# Patient Record
Sex: Male | Born: 1975 | Race: White | Hispanic: No | Marital: Married | Smoking: Former smoker
Health system: Southern US, Community
[De-identification: ages and names within clinical notes are randomized; demographics above are authoritative.]

## PROBLEM LIST (undated history)

## (undated) DIAGNOSIS — T7840XA Allergy, unspecified, initial encounter: Secondary | ICD-10-CM

## (undated) DIAGNOSIS — S42009A Fracture of unspecified part of unspecified clavicle, initial encounter for closed fracture: Secondary | ICD-10-CM

## (undated) DIAGNOSIS — F329 Major depressive disorder, single episode, unspecified: Secondary | ICD-10-CM

## (undated) DIAGNOSIS — Z8619 Personal history of other infectious and parasitic diseases: Secondary | ICD-10-CM

## (undated) DIAGNOSIS — F32A Depression, unspecified: Secondary | ICD-10-CM

## (undated) DIAGNOSIS — R739 Hyperglycemia, unspecified: Secondary | ICD-10-CM

## (undated) DIAGNOSIS — G4733 Obstructive sleep apnea (adult) (pediatric): Secondary | ICD-10-CM

## (undated) DIAGNOSIS — K219 Gastro-esophageal reflux disease without esophagitis: Secondary | ICD-10-CM

## (undated) HISTORY — DX: Major depressive disorder, single episode, unspecified: F32.9

## (undated) HISTORY — DX: Allergy, unspecified, initial encounter: T78.40XA

## (undated) HISTORY — DX: Personal history of other infectious and parasitic diseases: Z86.19

## (undated) HISTORY — DX: Fracture of unspecified part of unspecified clavicle, initial encounter for closed fracture: S42.009A

## (undated) HISTORY — PX: HERNIA REPAIR: SHX51

## (undated) HISTORY — DX: Obstructive sleep apnea (adult) (pediatric): G47.33

## (undated) HISTORY — DX: Depression, unspecified: F32.A

## (undated) HISTORY — DX: Hyperglycemia, unspecified: R73.9

---

## 2007-06-24 ENCOUNTER — Emergency Department: Payer: Self-pay | Admitting: Emergency Medicine

## 2008-10-08 IMAGING — CT CT ABD-PELV W/O CM
1 of 2 series · 15 of 32 positions shown, 19 images · non-contrast
Comparison: none

REASON FOR EXAM: (1) abdominal pain, vomiting; (2) abdominal pain
COMMENTS:   LMP: (Male)

[Series 2: stone · axial · 0.84mm/px · z∈[-25,+503]mm · 15 of 192 slices shown, 19 images]
[im 8/192  soft-tissue]
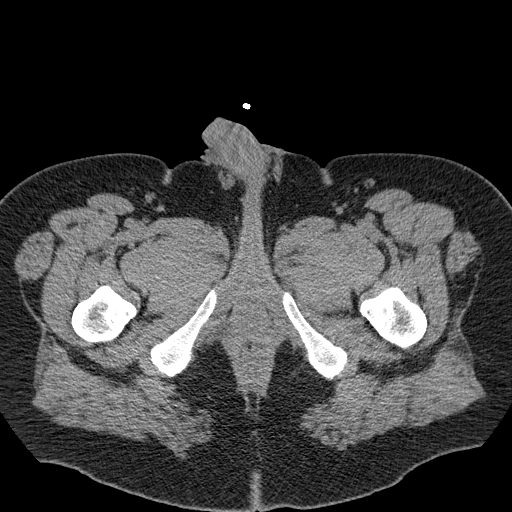
[im 8/192  bone]
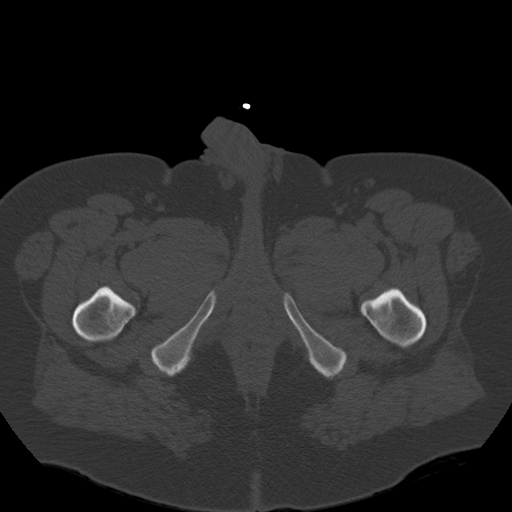
[im 23/192  soft-tissue]
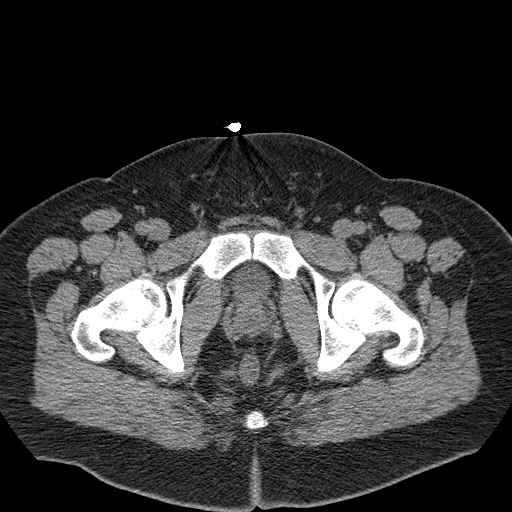
[im 37/192  soft-tissue]
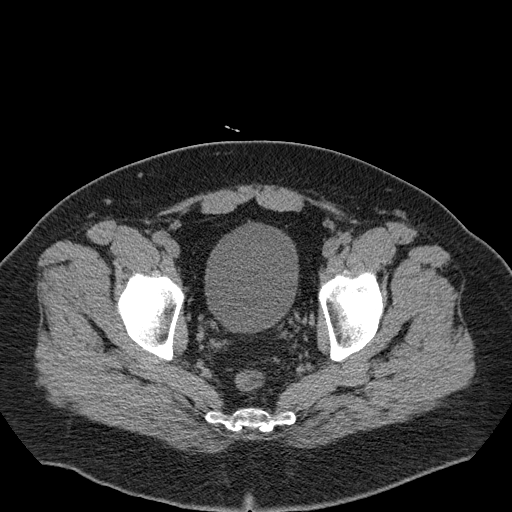
[im 52/192  soft-tissue]
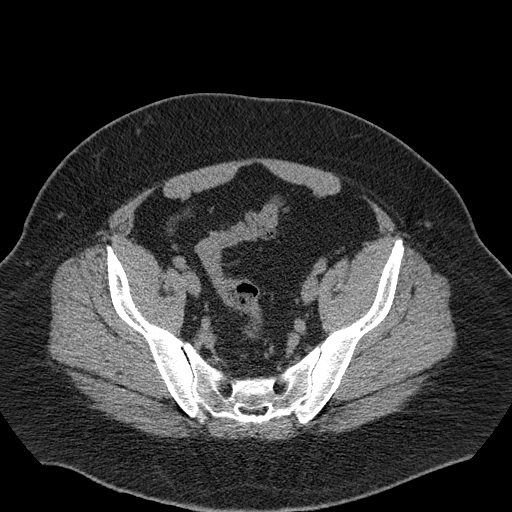
[im 67/192  soft-tissue]
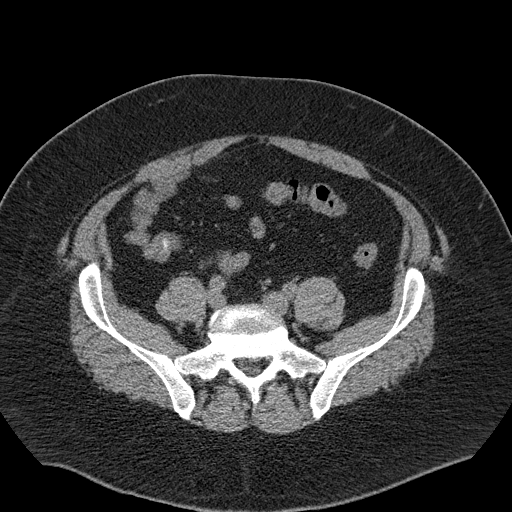
[im 81/192  soft-tissue]
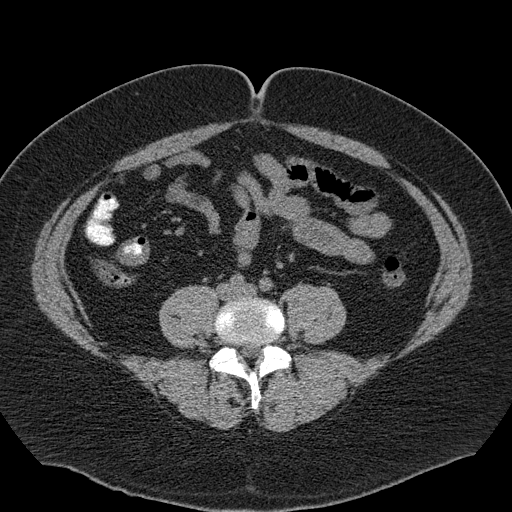
[im 96/192  soft-tissue]
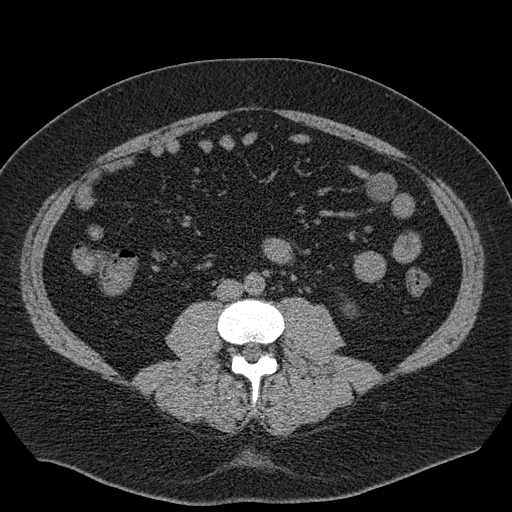
[im 111/192  soft-tissue]
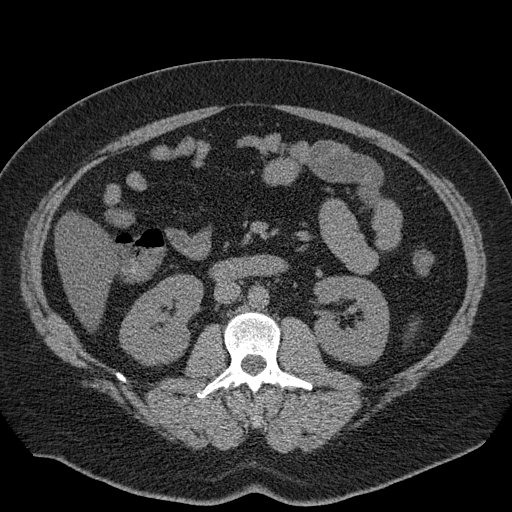
[im 125/192  soft-tissue]
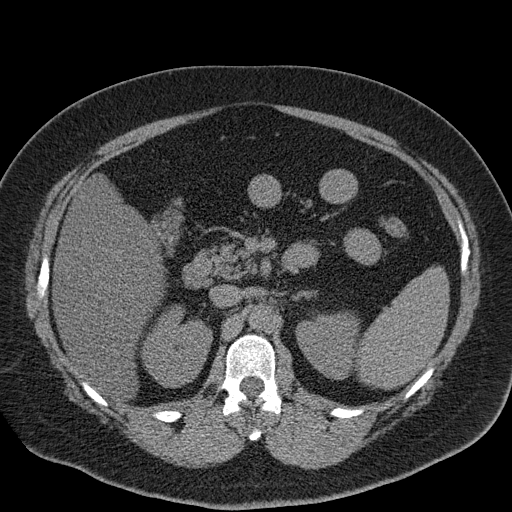
[im 125/192  bone]
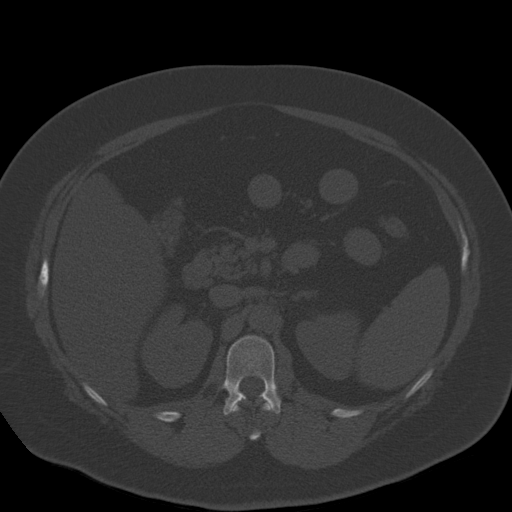
[im 140/192  soft-tissue]
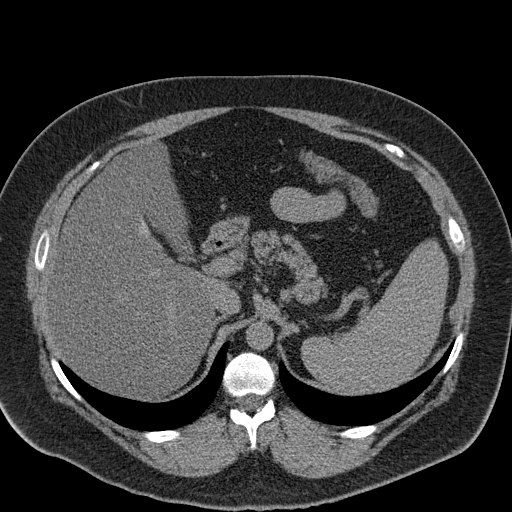
[im 155/192  soft-tissue]
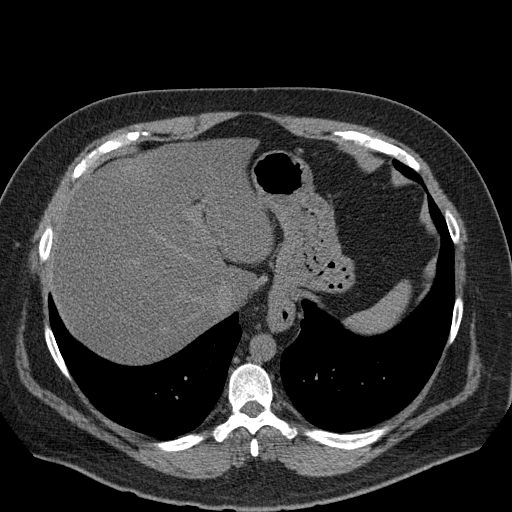
[im 162/192  lung]
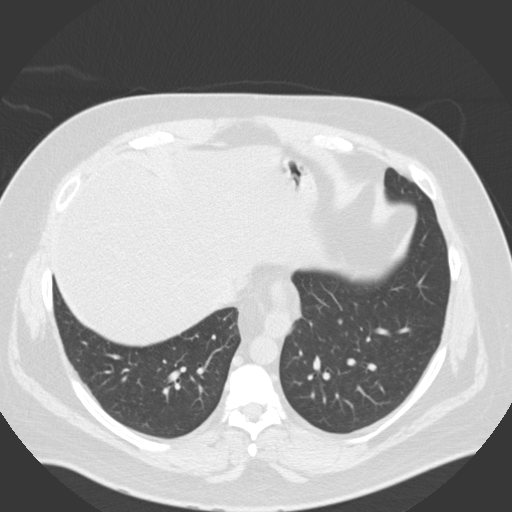
[im 169/192  soft-tissue]
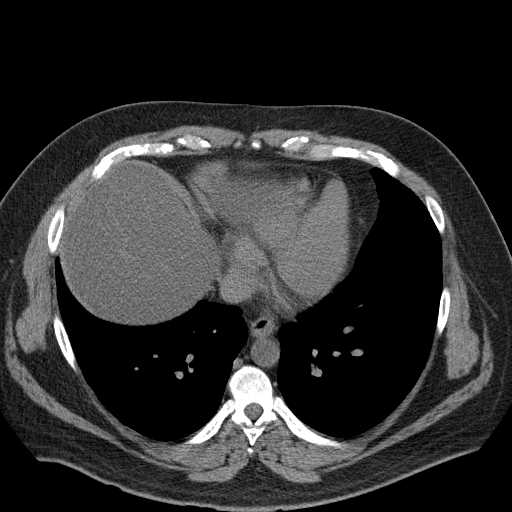
[im 169/192  lung]
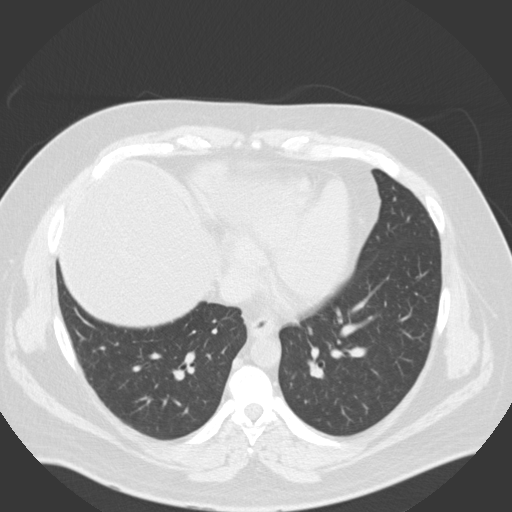
[im 177/192  lung]
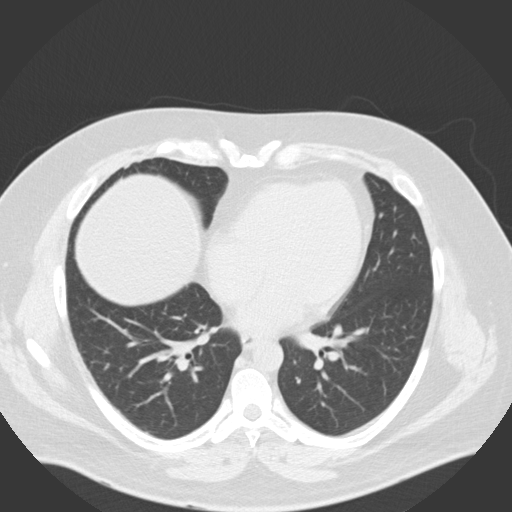
[im 184/192  soft-tissue]
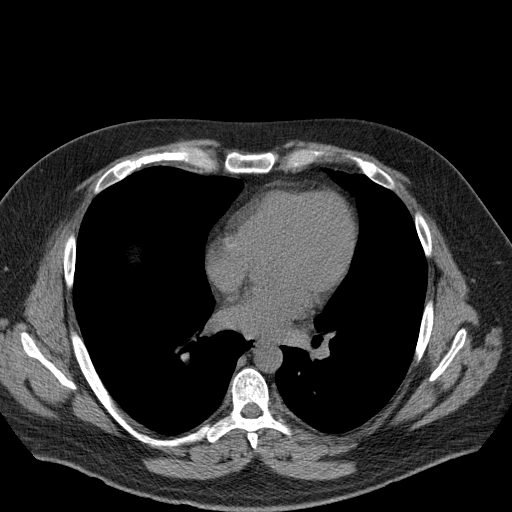
[im 184/192  lung]
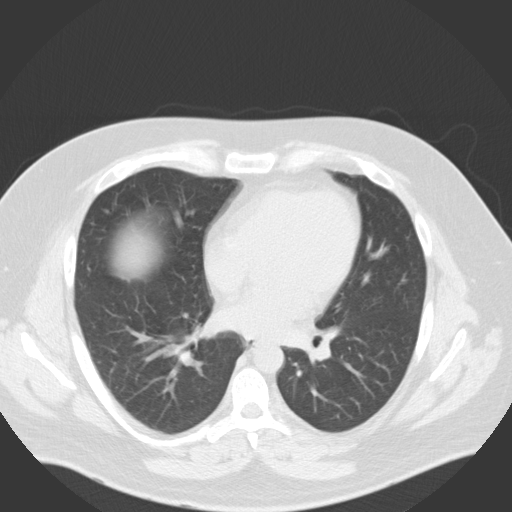

[15 of 32 positions shown; findings below may reference images not displayed]

PROCEDURE:     CT  - CT ABDOMEN AND PELVIS W[DATE]  [DATE]

RESULT:     Helical non-contrast 3 mm sections were obtained from the lung
bases through the pubic symphysis.

Evaluation of the lung bases demonstrates a very small vague 3 mm nodular
density within the posterior LEFT lower lobe. A second area is identified
within the posterolateral aspect of the RIGHT lower lobe.  The remaining
visualized upper abdominal viscera otherwise is grossly unremarkable.

Within the limitations of a non-contrast CT, the liver demonstrates a
diffuse low attenuating architecture. No evidence of liver masses is
identified. Evaluation of the RIGHT and LEFT kidneys demonstrates no
evidence of hydronephrosis, masses or calculi. There is no evidence of
hydroureter. The spleen, pancreas, adrenals are unremarkable. There is no
evidence of abdominal or pelvic free fluid, drainable loculated fluid
collections, masses or adenopathy. There is no evidence of an abdominal
aortic aneurysm. There is no CT evidence suggesting the sequela of bowel
obstruction, diverticulitis, colitis, enteritis, diverticulitis nor
appendicitis. No free fluid or drainable loculated fluid collections, masses
or adenopathy is appreciated.
IMPRESSION: 1)Very vague small nodules within the RIGHT and LEFT lung bases as described
above.

2)Hepatic steatosis.

3)Note; not mentioned above, there is a small hiatal hernia.

4)No evidence of focal or acute intraabdominal or intrapelvic abnormalities.

Dr. Connell of the Emergency Room was informed of these findings via a
preliminary faxed report on 06/25/07 at [DATE] Central Time.

## 2012-02-26 ENCOUNTER — Ambulatory Visit: Payer: Self-pay | Admitting: Family Medicine

## 2012-03-17 ENCOUNTER — Ambulatory Visit: Payer: Self-pay | Admitting: Family Medicine

## 2012-09-17 ENCOUNTER — Encounter (HOSPITAL_COMMUNITY): Payer: Self-pay | Admitting: *Deleted

## 2012-09-17 ENCOUNTER — Emergency Department (HOSPITAL_COMMUNITY)
Admission: EM | Admit: 2012-09-17 | Discharge: 2012-09-17 | Disposition: A | Discharge status: Home / Self Care | Payer: BC Managed Care – PPO | Attending: Emergency Medicine | Admitting: Emergency Medicine

## 2012-09-17 ENCOUNTER — Emergency Department (HOSPITAL_COMMUNITY): Payer: BC Managed Care – PPO

## 2012-09-17 DIAGNOSIS — S42009A Fracture of unspecified part of unspecified clavicle, initial encounter for closed fracture: Secondary | ICD-10-CM

## 2012-09-17 DIAGNOSIS — Y9389 Activity, other specified: Secondary | ICD-10-CM | POA: Insufficient documentation

## 2012-09-17 DIAGNOSIS — S42023A Displaced fracture of shaft of unspecified clavicle, initial encounter for closed fracture: Secondary | ICD-10-CM | POA: Insufficient documentation

## 2012-09-17 DIAGNOSIS — R071 Chest pain on breathing: Secondary | ICD-10-CM | POA: Insufficient documentation

## 2012-09-17 DIAGNOSIS — K219 Gastro-esophageal reflux disease without esophagitis: Secondary | ICD-10-CM | POA: Insufficient documentation

## 2012-09-17 DIAGNOSIS — Z79899 Other long term (current) drug therapy: Secondary | ICD-10-CM | POA: Insufficient documentation

## 2012-09-17 DIAGNOSIS — Y92410 Unspecified street and highway as the place of occurrence of the external cause: Secondary | ICD-10-CM | POA: Insufficient documentation

## 2012-09-17 HISTORY — DX: Gastro-esophageal reflux disease without esophagitis: K21.9

## 2012-09-17 MED ORDER — FENTANYL CITRATE 0.05 MG/ML IJ SOLN
100.0000 ug | Freq: Once | INTRAMUSCULAR | Status: AC
  Administered 2012-09-17: 100 ug via INTRAVENOUS
  Filled 2012-09-17: qty 2

## 2012-09-17 MED ORDER — OXYCODONE-ACETAMINOPHEN 5-325 MG PO TABS: 2.0000 | ORAL_TABLET | ORAL | Status: DC | PRN

## 2012-09-17 MED ORDER — MORPHINE SULFATE 4 MG/ML IJ SOLN
8.0000 mg | Freq: Once | INTRAMUSCULAR | Status: AC
  Administered 2012-09-17: 8 mg via INTRAVENOUS
  Filled 2012-09-17: qty 2

## 2012-09-17 MED ORDER — OXYCODONE-ACETAMINOPHEN 5-325 MG PO TABS
2.0000 | ORAL_TABLET | Freq: Once | ORAL | Status: AC
  Administered 2012-09-17: 2 via ORAL
  Filled 2012-09-17: qty 2

## 2012-09-17 NOTE — ED Notes (Signed)
Ortho paged for sling immobilizer  

## 2012-09-17 NOTE — ED Notes (Signed)
Patient on LSB, head blocks and c-collar per ems

## 2012-09-17 NOTE — Progress Notes (Signed)
Orthopedic Tech Progress Note Patient Details:  Dennis Berger Mar 02, 1976 [id number]  Ortho Devices Type of Ortho Device: Sling immobilizer Ortho Device/Splint Location: (L) UE Ortho Device/Splint Interventions: Application   Jennye Moccasin 09/17/2012, 9:22 PM
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
## 2012-09-17 NOTE — ED Notes (Signed)
Dr. Belfi at bedside 

## 2012-11-07 HISTORY — PX: OTHER SURGICAL HISTORY: SHX169

## 2014-01-01 LAB — LIPID PANEL
CHOLESTEROL: 204 mg/dL — AB (ref 0–200)
HDL: 44 mg/dL (ref 35–70)
LDL Cholesterol: 124 mg/dL
Triglycerides: 181 mg/dL — AB (ref 40–160)

## 2014-01-01 LAB — BASIC METABOLIC PANEL
BUN: 13 mg/dL (ref 4–21)
CREATININE: 0.9 mg/dL (ref 0.6–1.3)
GLUCOSE: 111 mg/dL
POTASSIUM: 5.2 mmol/L (ref 3.4–5.3)
Sodium: 141 mmol/L (ref 137–147)

## 2014-01-01 LAB — HEPATIC FUNCTION PANEL
ALT: 46 U/L — AB (ref 10–40)
AST: 33 U/L (ref 14–40)
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
## 2015-10-20 DIAGNOSIS — E669 Obesity, unspecified: Secondary | ICD-10-CM

## 2015-10-20 DIAGNOSIS — R030 Elevated blood-pressure reading, without diagnosis of hypertension: Secondary | ICD-10-CM

## 2015-10-20 DIAGNOSIS — K2100 Gastro-esophageal reflux disease with esophagitis, without bleeding: Secondary | ICD-10-CM | POA: Insufficient documentation

## 2015-10-20 DIAGNOSIS — IMO0001 Reserved for inherently not codable concepts without codable children: Secondary | ICD-10-CM | POA: Insufficient documentation

## 2015-10-20 DIAGNOSIS — G4733 Obstructive sleep apnea (adult) (pediatric): Secondary | ICD-10-CM

## 2015-10-20 DIAGNOSIS — R739 Hyperglycemia, unspecified: Secondary | ICD-10-CM | POA: Insufficient documentation

## 2015-10-21 ENCOUNTER — Ambulatory Visit: Payer: Self-pay | Admitting: Family Medicine

## 2016-09-03 ENCOUNTER — Encounter: Payer: Self-pay | Admitting: Family Medicine

## 2018-08-19 NOTE — Progress Notes (Signed)
Patient: Dennis Berger, Male    DOB: [birth year], 42 y.o.   MRN: [id number] Visit Date: 08/21/2018  Today's Provider: Trey Sailors, PA-C   Chief Complaint  Patient presents with  . Establish Care  . Depression   Subjective:    Establish Care: Dennis Berger is a 42 y.o. male who presents today to establish care. He feels well. He reports exercising none. He reports he is sleeping fairly well. Patient lives locally with his wife, mother and father in law, and six children. 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   He was previously seen in this clinic in 2015 by Dr. Sherrie Mustache, but has not been seen since then. At that time he had multiple diagnoses including morbid obesity, sleep apnea, and hypertension.  Morbid Obesity: Prior clinic notes show lowest weight of 351. He has gained around 40 lbs since that time.   Wt Readings from Last 3 Encounters:  08/20/18 (!) 391 lb (177.4 kg)  12/30/13 (!) 375 lb (170.1 kg)   Sleep Apnea: Patient had a sleep study completed in 2013 per [facility] and it revealed severe obstructive sleep apnea. He reports he has a single pressure setting CPAP machine that he has been using, but somewhat inconsistently. He reports he has been trying to "wean off" the CPAP machine because he doesn't want to become dependent on it. He is not sure if the pressure setting is working any more.  HTN: He was previously treated for HTN with lisinopril which he reports he discontinued due to sexual dysfunction. He was then treated with amlodipine which was discontinued for reasons unknown. He has not been seen in this clinic for years and has not had blood pressure medication during that time.   BP Readings from Last 3 Encounters:  08/20/18 (!) 150/100  12/30/13 (!) 156/78  09/17/12 138/71   Hyperglycemia: A CMET in [facility] recorded glucose at 111 during his previous visits. Unclear if this was fasting. Patient has gained 40  lbs since that point.   Depression and Anxiety: Patient reports he feels down and depressed. Reports he has trouble getting out of bed some days. Reports he has minimal patients with his children. He reports he has become more wary of crowds and has trouble going to church now. Denies suicidal or homicidal thoughts. He reports he has been using his wife's wellbutrin for depression at a dose of 75 mg twice daily. He says this has helped him.  ----------------------------------------------------------------- Depression screen PHQ 2/9 08/20/2018  Decreased Interest 3  Down, Depressed, Hopeless 1  PHQ - 2 Score 4  Altered sleeping 3  Tired, decreased energy 3  Change in appetite 2  Feeling bad or failure about yourself  1  Trouble concentrating 0  Moving slowly or fidgety/restless 0  Suicidal thoughts 0  PHQ-9 Score 13  Difficult doing work/chores Very difficult    Review of Systems  Constitutional: Negative.   HENT: Negative.   Eyes: Negative.   Respiratory: Negative.   Cardiovascular: Negative.   Gastrointestinal: Negative.   Endocrine: Negative.   Genitourinary: Negative.   Musculoskeletal: Negative.   Skin: Negative.   Allergic/Immunologic: Negative.   Neurological: Negative.   Hematological: Negative.   Psychiatric/Behavioral: The patient is nervous/anxious.     Social History      He  reports that he quit smoking about 12 years ago. He has never used smokeless tobacco. He reports that he drinks  alcohol. He reports that he does not use drugs.       Social History   Socioeconomic History  . Marital status: Married    Spouse name: Not on file  . Number of children: 3  . Years of education: Not on file  . Highest education level: Not on file  Occupational History  . Not on file  Social Needs  . Financial resource strain: Not on file  . Food insecurity:    Worry: Not on file    Inability: Not on file  . Transportation needs:    Medical: Not on file    Non-medical:  Not on file  Tobacco Use  . Smoking status: Former Smoker    Last attempt to quit: 09/17/2005    Years since quitting: 12.9  . Smokeless tobacco: Never Used  . Tobacco comment: Quit 2007  Substance and Sexual Activity  . Alcohol use: Yes    Alcohol/week: 0.0 standard drinks    Comment: approx 6 beers today  . Drug use: No  . Sexual activity: Yes  Lifestyle  . Physical activity:    Days per week: Not on file    Minutes per session: Not on file  . Stress: Not on file  Relationships  . Social connections:    Talks on phone: Not on file    Gets together: Not on file    Attends religious service: Not on file    Active member of club or organization: Not on file    Attends meetings of clubs or organizations: Not on file    Relationship status: Not on file  Other Topics Concern  . Not on file  Social History Narrative  . Not on file    Past Medical History:  Diagnosis Date  . Allergy   . Clavicle fracture   . Depression   . GERD (gastroesophageal reflux disease)   . History of chicken pox   . Hyperglycemia   . OSA (obstructive sleep apnea)      Patient Active Problem List   Diagnosis Date Noted  . Obesity 10/20/2015  . Obstructive sleep apnea 10/20/2015  . Elevated blood pressure 10/20/2015  . Reflux esophagitis 10/20/2015  . Hyperglycemia 10/20/2015    Past Surgical History:  Procedure Laterality Date  . HERNIA REPAIR Left   . left clavicle fracture ORIF  11/07/2012    Family History        Family Status  Relation Name Status  . Mother  Deceased at age 13  . Father  Alive  . Sister  (Not Specified)        His family history includes Breast cancer in his mother; Cancer in his mother; Diabetes in his sister.      Allergies  Allergen Reactions  . Motrin [Ibuprofen] Shortness Of Breath  . Nsaids   . Penicillins Hives     Current Outpatient Medications:  .  omeprazole (PRILOSEC) 20 MG capsule, Take 20 mg by mouth daily., Disp: , Rfl:  .  amLODipine  (NORVASC) 5 MG tablet, Take 1 tablet (5 mg total) by mouth daily., Disp: 90 tablet, Rfl: 0 .  busPIRone (BUSPAR) 7.5 MG tablet, Take 7.5 mg twice daily for one week. Then take 15 mg in the morning and 7.5 mg nightly x 1 wk. Then take 15 mg twice daily onward., Disp: 180 tablet, Rfl: 0   Patient Care Team: Maryella Shivers as PCP - General (Physician Assistant) Malva Limes, MD as Referring Physician (Family  Medicine)      Objective:   Vitals: BP (!) 150/100 (BP Location: Left Arm, Patient Position: Sitting, Cuff Size: Large)   Pulse 74   Temp 98.1 F (36.7 C) (Oral)   Resp 16   Ht 6\' 3"  (1.905 m)   Wt (!) 391 lb (177.4 kg)   SpO2 97%   BMI 48.87 kg/m    Vitals:   08/20/18 1019  BP: (!) 150/100  Pulse: 74  Resp: 16  Temp: 98.1 F (36.7 C)  TempSrc: Oral  SpO2: 97%  Weight: (!) 391 lb (177.4 kg)  Height: 6\' 3"  (1.905 m)     Physical Exam  Constitutional: He is oriented to person, place, and time. He appears well-developed and well-nourished.  Cardiovascular: Normal rate and regular rhythm.  Pulmonary/Chest: Effort normal and breath sounds normal.  Neurological: He is alert and oriented to person, place, and time.  Skin: Skin is warm and dry.  Psychiatric: He has a normal mood and affect. His behavior is normal.     Depression Screen PHQ 2/9 Scores 08/20/2018  PHQ - 2 Score 4  PHQ- 9 Score 13      Assessment & Plan:     Routine Health Maintenance and Physical Exam  Exercise Activities and Dietary recommendations Goals   None     Immunization History  Administered Date(s) Administered  . Tdap 05/17/2011    Health Maintenance  Topic Date Due  . HIV Screening  11/13/1990  . INFLUENZA VACCINE  08/18/2019 (Originally 04/17/2018)  . TETANUS/TDAP  05/16/2021     Discussed health benefits of physical activity, and encouraged him to engage in regular exercise appropriate for his age and condition.    1. Depression with anxiety  Will start on  Buspar as below as patient does not wish to start SSRI 2/2 possible sexual side effects. Advised it is not safe or ideal to take his wife's depression medication, especially since she likely needs it for herself.   - busPIRone (BUSPAR) 7.5 MG tablet; Take 7.5 mg twice daily for one week. Then take 15 mg in the morning and 7.5 mg nightly x 1 wk. Then take 15 mg twice daily onward.  Dispense: 180 tablet; Refill: 0  2. Essential hypertension  Will start amlodipine as below.  - amLODipine (NORVASC) 5 MG tablet; Take 1 tablet (5 mg total) by mouth daily.  Dispense: 90 tablet; Refill: 0  3. Hyperglycemia Previous hyperglycemic reading, concerned for prediabetes or diabetes.   - Comprehensive Metabolic Panel (CMET) - HgB A1c  4. Morbid obesity (HCC)  Discussed weight is critical. Will address further at follow up.   - Lipid Profile - TSH  5. Screening for deficiency anemia  - CBC with Differential  6. Encounter for screening for HIV  - HIV antibody (with reflex)  7. Obstructive sleep apnea  Discussed that he cannot "wean off" his CPAP and it is not something he will get "dependent" on as he has sleep apnea regardless of whether he uses his CPAP or not. Likely, his sleep apnea has worsened due to weight gain and it would be ideal to have a CPAP titration study. However, he is self pay and this may be cost prohibitive for him. It might be possible that we can use his old study to order an auto-titrating CPAP. He wishes to discuss this with his wife.   Return in about 6 weeks (around 10/01/2018) for HTN, sleep apnea .  The entirety of the information documented in the  History of Present Illness, Review of Systems and Physical Exam were personally obtained by me. Portions of this information were initially documented by Hetty Ely, CMA and reviewed by me for thoroughness and accuracy.       --------------------------------------------------------------------    Trey Sailors, PA-C  [facility]

## 2018-08-20 ENCOUNTER — Ambulatory Visit: Payer: Self-pay | Admitting: Physician Assistant

## 2018-08-20 ENCOUNTER — Encounter: Payer: Self-pay | Admitting: Physician Assistant

## 2018-08-20 VITALS — BP 150/100 | HR 74 | Temp 98.1°F | Resp 16 | Ht 75.0 in | Wt 391.0 lb

## 2018-08-20 DIAGNOSIS — F418 Other specified anxiety disorders: Secondary | ICD-10-CM

## 2018-08-20 DIAGNOSIS — Z130 Encounter for screening for diseases of the blood and blood-forming organs and certain disorders involving the immune mechanism: Secondary | ICD-10-CM

## 2018-08-20 DIAGNOSIS — G4733 Obstructive sleep apnea (adult) (pediatric): Secondary | ICD-10-CM

## 2018-08-20 DIAGNOSIS — Z114 Encounter for screening for human immunodeficiency virus [HIV]: Secondary | ICD-10-CM

## 2018-08-20 DIAGNOSIS — R739 Hyperglycemia, unspecified: Secondary | ICD-10-CM

## 2018-08-20 DIAGNOSIS — I10 Essential (primary) hypertension: Secondary | ICD-10-CM

## 2018-08-20 MED ORDER — BUSPIRONE HCL 7.5 MG PO TABS: ORAL_TABLET | ORAL | 0 refills | Status: DC

## 2018-08-20 MED ORDER — AMLODIPINE BESYLATE 5 MG PO TABS: 5.0000 mg | ORAL_TABLET | Freq: Every day | ORAL | 0 refills | Status: DC

## 2018-08-20 NOTE — Patient Instructions (Signed)
Living With Depression Everyone experiences occasional disappointment, sadness, and loss in their lives. When you are feeling down, blue, or sad for at least 2 weeks in a row, it may mean that you have depression. Depression can affect your thoughts and feelings, relationships, daily activities, and physical health. It is caused by changes in the way your brain functions. If you receive a diagnosis of depression, your health care provider will tell you which type of depression you have and what treatment options are available to you. If you are living with depression, there are ways to help you recover from it and also ways to prevent it from coming back. How to cope with lifestyle changes Coping with stress Stress is your body's reaction to life changes and events, both good and bad. Stressful situations may include:  Getting married.  The death of a spouse.  Losing a job.  Retiring.  Having a baby.  Stress can last just a few hours or it can be ongoing. Stress can play a major role in depression, so it is important to learn both how to cope with stress and how to think about it differently. Talk with your health care provider or a counselor if you would like to learn more about stress reduction. He or she may suggest some stress reduction techniques, such as:  Music therapy. This can include creating music or listening to music. Choose music that you enjoy and that inspires you.  Mindfulness-based meditation. This kind of meditation can be done while sitting or walking. It involves being aware of your normal breaths, rather than trying to control your breathing.  Centering prayer. This is a kind of meditation that involves focusing on a spiritual word or phrase. Choose a word, phrase, or sacred image that is meaningful to you and that brings you peace.  Deep breathing. To do this, expand your stomach and inhale slowly through your nose. Hold your breath for 3-5 seconds, then exhale  slowly, allowing your stomach muscles to relax.  Muscle relaxation. This involves intentionally tensing muscles then relaxing them.  Choose a stress reduction technique that fits your lifestyle and personality. Stress reduction techniques take time and practice to develop. Set aside 5-15 minutes a day to do them. Therapists can offer training in these techniques. The training may be covered by some insurance plans. Other things you can do to manage stress include:  Keeping a stress diary. This can help you learn what triggers your stress and ways to control your response.  Understanding what your limits are and saying no to requests or events that lead to a schedule that is too full.  Thinking about how you respond to certain situations. You may not be able to control everything, but you can control how you react.  Adding humor to your life by watching funny films or TV shows.  Making time for activities that help you relax and not feeling guilty about spending your time this way.  Medicines Your health care provider may suggest certain medicines if he or she feels that they will help improve your condition. Avoid using alcohol and other substances that may prevent your medicines from working properly (may interact). It is also important to:  Talk with your pharmacist or health care provider about all the medicines that you take, their possible side effects, and what medicines are safe to take together.  Make it your goal to take part in all treatment decisions (shared decision-making). This includes giving input on the side   effects of medicines. It is Zertuche if shared decision-making with your health care provider is part of your total treatment plan.  If your health care provider prescribes a medicine, you may not notice the full benefits of it for 4-8 weeks. Most people who are treated for depression need to be on medicine for at least 6-12 months after they feel better. If you are taking  medicines as part of your treatment, do not stop taking medicines without first talking to your health care provider. You may need to have the medicine slowly decreased (tapered) over time to decrease the risk of harmful side effects. Relationships Your health care provider may suggest family therapy along with individual therapy and drug therapy. While there may not be family problems that are causing you to feel depressed, it is still important to make sure your family learns as much as they can about your mental health. Having your family's support can help make your treatment successful. How to recognize changes in your condition Everyone has a different response to treatment for depression. Recovery from major depression happens when you have not had signs of major depression for two months. This may mean that you will start to:  Have more interest in doing activities.  Feel less hopeless than you did 2 months ago.  Have more energy.  Overeat less often, or have better or improving appetite.  Have better concentration.  Your health care provider will work with you to decide the next steps in your recovery. It is also important to recognize when your condition is getting worse. Watch for these signs:  Having fatigue or low energy.  Eating too much or too little.  Sleeping too much or too little.  Feeling restless, agitated, or hopeless.  Having trouble concentrating or making decisions.  Having unexplained physical complaints.  Feeling irritable, angry, or aggressive.  Get help as soon as you or your family members notice these symptoms coming back. How to get support and help from others How to talk with friends and family members about your condition Talking to friends and family members about your condition can provide you with one way to get support and guidance. Reach out to trusted friends or family members, explain your symptoms to them, and let them know that you are  working with a health care provider to treat your depression. Financial resources Not all insurance plans cover mental health care, so it is important to check with your insurance carrier. If paying for co-pays or counseling services is a problem, search for a local or county mental health care center. They may be able to offer public mental health care services at low or no cost when you are not able to see a private health care provider. If you are taking medicine for depression, you may be able to get the generic form, which may be less expensive. Some makers of prescription medicines also offer help to patients who cannot afford the medicines they need. Follow these instructions at home:  Get the right amount and quality of sleep.  Cut down on using caffeine, tobacco, alcohol, and other potentially harmful substances.  Try to exercise, such as walking or lifting small weights.  Take over-the-counter and prescription medicines only as told by your health care provider.  Eat a healthy diet that includes plenty of vegetables, fruits, whole grains, low-fat dairy products, and lean protein. Do not eat a lot of foods that are high in solid fats, added sugars, or salt.    Keep all follow-up visits as told by your health care provider. This is important. Contact a health care provider if:  You stop taking your antidepressant medicines, and you have any of these symptoms: ? Nausea. ? Headache. ? Feeling lightheaded. ? Chills and body aches. ? Not being able to sleep (insomnia).  You or your friends and family think your depression is getting worse. Get help right away if:  You have thoughts of hurting yourself or others. If you ever feel like you may hurt yourself or others, or have thoughts about taking your own life, get help right away. You can go to your nearest emergency department or call:  Your local emergency services (911 in the [place]  A suicide crisis helpline, such as the  National Suicide Prevention Lifeline at 1-800-273-8255. This is open 24-hours a day.  Summary  If you are living with depression, there are ways to help you recover from it and also ways to prevent it from coming back.  Work with your health care team to create a management plan that includes counseling, stress management techniques, and healthy lifestyle habits. This information is not intended to replace advice given to you by your health care provider. Make sure you discuss any questions you have with your health care provider. Document Released: 08/06/2016 Document Revised: 08/06/2016 Document Reviewed: 08/06/2016 Elsevier Interactive Patient Education  2018 [employer]vier Inc[employer]  

## 2018-09-30 ENCOUNTER — Encounter: Payer: Self-pay | Admitting: Physician Assistant

## 2018-09-30 ENCOUNTER — Ambulatory Visit: Payer: Self-pay | Admitting: Physician Assistant

## 2018-09-30 ENCOUNTER — Other Ambulatory Visit: Payer: Self-pay | Admitting: Physician Assistant

## 2018-09-30 VITALS — BP 152/100 | HR 78 | Temp 97.8°F | Resp 16 | Wt 394.0 lb

## 2018-09-30 DIAGNOSIS — I10 Essential (primary) hypertension: Secondary | ICD-10-CM

## 2018-09-30 DIAGNOSIS — F419 Anxiety disorder, unspecified: Secondary | ICD-10-CM | POA: Insufficient documentation

## 2018-09-30 DIAGNOSIS — E1165 Type 2 diabetes mellitus with hyperglycemia: Secondary | ICD-10-CM

## 2018-09-30 MED ORDER — BUSPIRONE HCL 15 MG PO TABS: 15.0000 mg | ORAL_TABLET | Freq: Two times a day (BID) | ORAL | 0 refills | Status: DC

## 2018-09-30 MED ORDER — AMLODIPINE BESYLATE 10 MG PO TABS: 10.0000 mg | ORAL_TABLET | Freq: Every day | ORAL | 0 refills | Status: DC

## 2018-09-30 NOTE — Patient Instructions (Signed)

## 2018-09-30 NOTE — Progress Notes (Signed)
Patient: Dennis Berger Male    DOB: Jun 01, 1976   43 y.o.   MRN: [id number] Visit Date: 09/30/2018  Today's Provider: Trey Sailors, PA-C   Chief Complaint  Patient presents with  . Follow-up   Subjective:     HPI  Follow up for Depression and anxiety  The patient was last seen for this 1 months ago. Changes made at last visit include start Buspar 15 mg BID.  He reports excellent compliance with treatment. He feels that condition is Improved. Reports he feels better in terms of anxiety. He is not having side effects.   Depression screen [facility] 2/9 09/30/2018 08/20/2018  Decreased Interest 0 3  Down, Depressed, Hopeless 0 1  PHQ - 2 Score 0 4  Altered sleeping 2 3  Tired, decreased energy 1 3  Change in appetite 1 2  Feeling bad or failure about yourself  0 1  Trouble concentrating 0 0  Moving slowly or fidgety/restless 0 0  Suicidal thoughts 0 0  PHQ-9 Score 4 13  Difficult doing work/chores Not difficult at all Very difficult    Morbid Obesity: Has gained 20 pounds over the past 5 years. Current BMI 49.25. Had elevated blood sugars on lab work 5 years ago. Did not get labs in December as requested, reports he will get them today.   Wt Readings from Last 3 Encounters:  09/30/18 (!) 394 lb (178.7 kg)  08/20/18 (!) 391 lb (177.4 kg)  12/30/13 (!) 375 lb (170.1 kg)   Daily Eating:   9:00 AM wakes up 32 ounces orange juice (twice daily) Coffee - sugar and cream  He reports he is a vegetarian.  Potato with brussel sprouts Pizza: half a frozen pizza Veggie lasagna Collard greens and potatoes Potato taco, mexican pizza at [employer] Occasionally drinks a coke.  HTN: Was started on amlodipine 5 mg daily last visit. Blood pressure remains largely the same. He reports he is taking it daily and denies side effects including pedal edema.   BP Readings from Last 3 Encounters:  09/30/18 (!) 152/100  08/20/18 (!) 150/100  12/30/13 (!) 156/78     ------------------------------------------------------------------------------------  Allergies  Allergen Reactions  . Motrin [Ibuprofen] Shortness Of Breath  . Nsaids   . Penicillins Hives     Current Outpatient Medications:  .  amLODipine (NORVASC) 5 MG tablet, Take 1 tablet (5 mg total) by mouth daily., Disp: 90 tablet, Rfl: 0 .  busPIRone (BUSPAR) 7.5 MG tablet, Take 7.5 mg twice daily for one week. Then take 15 mg in the morning and 7.5 mg nightly x 1 wk. Then take 15 mg twice daily onward., Disp: 180 tablet, Rfl: 0 .  omeprazole (PRILOSEC) 20 MG capsule, Take 20 mg by mouth daily., Disp: , Rfl:   Review of Systems  Constitutional: Negative.   Respiratory: Negative.   Cardiovascular: Negative.   Psychiatric/Behavioral: Negative.     Social History   Tobacco Use  . Smoking status: Former Smoker    Last attempt to quit: 09/17/2005    Years since quitting: 13.0  . Smokeless tobacco: Never Used  . Tobacco comment: Quit 2007  Substance Use Topics  . Alcohol use: Yes    Alcohol/week: 0.0 standard drinks    Comment: approx 6 beers today      Objective:   BP (!) 152/100 (BP Location: Left Arm, Patient Position: Sitting, Cuff Size: Normal)   Pulse 78   Temp 97.8 F (36.6 C) (Oral)  Resp 16   Wt (!) 394 lb (178.7 kg)   BMI 49.25 kg/m  Vitals:   09/30/18 0950  BP: (!) 152/100  Pulse: 78  Resp: 16  Temp: 97.8 F (36.6 C)  TempSrc: Oral  Weight: (!) 394 lb (178.7 kg)     Physical Exam      Assessment & Plan    1. Anxiety  Continue medication as below.  - busPIRone (BUSPAR) 15 MG tablet; Take 1 tablet (15 mg total) by mouth 2 (two) times daily.  Dispense: 180 tablet; Refill: 0  2. Hypertension, unspecified type  Uncontrolled, increase amlodipine to 10 mg daily.   3. Morbid obesity (HCC)  Have had extended discussion about nutrition in relation to his weight and insulin resistance. Patient feels that because he makes his own orange juice that it  is natural sugar and therefore good for him. He reports his wife is a Engineer, civil (consulting)nurse and has done a lot of research regarding this and how it is good for his gut health so he drinks 64 ounces of orange juice daily. Have counseled that although it is natural sugar it is still an excessive amount of sugar and a simple way to reduce calories would be to reduce juice intake. Patient is not receptive to this, saying it would bring about a lot of questions in his household. Offered him a referral to chronic care management for further education on this and he regards.   4. Type 2 diabetes mellitus with hyperglycemia, without long-term current use of insulin (HCC)  A1c is 7.8. This is a new diagnosis for him and we will have to see him back in clinic to discuss treatment of this.   Return in about 1 month (around 10/31/2018) for HTN .  The entirety of the information documented in the History of Present Illness, Review of Systems and Physical Exam were personally obtained by me. Portions of this information were initially documented by Rondel BatonSulibeya Dimas, CMA and reviewed by me for thoroughness and accuracy.         Trey SailorsAdriana M Nekesha Font, PA-C  [facility]

## 2018-10-01 ENCOUNTER — Telehealth: Payer: Self-pay

## 2018-10-01 LAB — HIV ANTIBODY (ROUTINE TESTING W REFLEX): HIV Screen 4th Generation wRfx: NONREACTIVE

## 2018-10-01 LAB — LIPID PANEL
Chol/HDL Ratio: 5.1 ratio — ABNORMAL HIGH (ref 0.0–5.0)
Cholesterol, Total: 194 mg/dL (ref 100–199)
HDL: 38 mg/dL — ABNORMAL LOW (ref >39)
LDL Calculated: 121 mg/dL — ABNORMAL HIGH (ref 0–99)
Triglycerides: 174 mg/dL — ABNORMAL HIGH (ref 0–149)
VLDL Cholesterol Cal: 35 mg/dL (ref 5–40)

## 2018-10-01 LAB — HEMOGLOBIN A1C
Est. average glucose Bld gHb Est-mCnc: 177 mg/dL
Hgb A1c MFr Bld: 7.8 % — ABNORMAL HIGH (ref 4.8–5.6)

## 2018-10-01 LAB — COMPREHENSIVE METABOLIC PANEL
ALT: 51 IU/L — ABNORMAL HIGH (ref 0–44)
AST: 39 IU/L (ref 0–40)
Albumin/Globulin Ratio: 1.6 (ref 1.2–2.2)
Albumin: 4.6 g/dL (ref 3.5–5.5)
Alkaline Phosphatase: 71 IU/L (ref 39–117)
BUN/Creatinine Ratio: 14 (ref 9–20)
BUN: 11 mg/dL (ref 6–24)
Bilirubin Total: 0.5 mg/dL (ref 0.0–1.2)
CO2: 25 mmol/L (ref 20–29)
Calcium: 9.5 mg/dL (ref 8.7–10.2)
Chloride: 99 mmol/L (ref 96–106)
Creatinine, Ser: 0.77 mg/dL (ref 0.76–1.27)
GFR calc Af Amer: 129 mL/min/{1.73_m2} (ref >59)
GFR calc non Af Amer: 112 mL/min/{1.73_m2} (ref >59)
Globulin, Total: 2.8 g/dL (ref 1.5–4.5)
Glucose: 165 mg/dL — ABNORMAL HIGH (ref 65–99)
Potassium: 4.5 mmol/L (ref 3.5–5.2)
Sodium: 141 mmol/L (ref 134–144)
Total Protein: 7.4 g/dL (ref 6.0–8.5)

## 2018-10-01 LAB — CBC WITH DIFFERENTIAL/PLATELET
Basophils Absolute: 0.1 10*3/uL (ref 0.0–0.2)
Basos: 1 %
EOS (ABSOLUTE): 0.1 10*3/uL (ref 0.0–0.4)
Eos: 2 %
Hematocrit: 44.9 % (ref 37.5–51.0)
Hemoglobin: 15.1 g/dL (ref 13.0–17.7)
Immature Grans (Abs): 0.1 10*3/uL (ref 0.0–0.1)
Immature Granulocytes: 1 %
Lymphocytes Absolute: 2 10*3/uL (ref 0.7–3.1)
Lymphs: 30 %
MCH: 28.2 pg (ref 26.6–33.0)
MCHC: 33.6 g/dL (ref 31.5–35.7)
MCV: 84 fL (ref 79–97)
Monocytes Absolute: 0.4 10*3/uL (ref 0.1–0.9)
Monocytes: 6 %
Neutrophils Absolute: 4 10*3/uL (ref 1.4–7.0)
Neutrophils: 60 %
Platelets: 247 10*3/uL (ref 150–450)
RBC: 5.36 x10E6/uL (ref 4.14–5.80)
RDW: 13.3 % (ref 11.6–15.4)
WBC: 6.6 10*3/uL (ref 3.4–10.8)

## 2018-10-01 LAB — TSH: TSH: 1.92 u[IU]/mL (ref 0.450–4.500)

## 2018-10-01 NOTE — Telephone Encounter (Signed)
[id number] 

## 2018-10-01 NOTE — Telephone Encounter (Signed)
[email or web address]

## 2018-10-01 NOTE — Telephone Encounter (Signed)
-----   Message from Trey Sailors, [place] sent at 10/01/2018  8:18 AM EST ----- Cholesterol elevated. THS norma, HIV negative, CBC normal. A1c shows he has diabetes. If possible for patient, please schedule for two week follow up for DM and he can cancel one month follow up as his blood pressure medication should have worked by then. Please make it 40 minutes.

## 2018-10-02 ENCOUNTER — Ambulatory Visit: Payer: Self-pay | Admitting: Physician Assistant

## 2018-10-02 NOTE — Telephone Encounter (Signed)
Patient advised as below. Patient will call back to schedule appt. sd

## 2018-10-02 NOTE — Telephone Encounter (Signed)
[email or web address]

## 2018-10-30 ENCOUNTER — Other Ambulatory Visit: Payer: Self-pay | Admitting: Physician Assistant

## 2018-10-30 DIAGNOSIS — F419 Anxiety disorder, unspecified: Secondary | ICD-10-CM

## 2018-10-30 MED ORDER — BUSPIRONE HCL 15 MG PO TABS: 15.0000 mg | ORAL_TABLET | Freq: Two times a day (BID) | ORAL | 0 refills | Status: AC

## 2018-10-30 NOTE — Telephone Encounter (Signed)
L.O.V. was 09/30/2018, please advise.

## 2018-10-30 NOTE — Telephone Encounter (Signed)
Pt needing refill on:  busPIRone (BUSPAR) 15 MG tablet   Please fill at:  [facility], [place] - [address] [phone] (Phone) [phone] (Fax)   Thanks, [employer]

## 2018-10-31 ENCOUNTER — Ambulatory Visit: Payer: Self-pay | Admitting: Physician Assistant

## 2018-11-07 ENCOUNTER — Ambulatory Visit: Payer: Self-pay | Admitting: Physician Assistant

## 2018-11-24 ENCOUNTER — Encounter: Payer: Self-pay | Admitting: Family Medicine

## 2018-11-24 ENCOUNTER — Ambulatory Visit (INDEPENDENT_AMBULATORY_CARE_PROVIDER_SITE_OTHER): Payer: Self-pay | Admitting: Family Medicine

## 2018-11-24 VITALS — BP 170/106 | HR 76 | Temp 98.7°F | Resp 16 | Ht 75.0 in | Wt 396.0 lb

## 2018-11-24 DIAGNOSIS — I10 Essential (primary) hypertension: Secondary | ICD-10-CM

## 2018-11-24 DIAGNOSIS — N529 Male erectile dysfunction, unspecified: Secondary | ICD-10-CM

## 2018-11-24 DIAGNOSIS — F419 Anxiety disorder, unspecified: Secondary | ICD-10-CM

## 2018-11-24 MED ORDER — SERTRALINE HCL 50 MG PO TABS
50.0000 mg | ORAL_TABLET | Freq: Every day | ORAL | 1 refills | Status: DC
Start: 2018-11-24 — End: 2019-02-23

## 2018-11-24 MED ORDER — SILDENAFIL CITRATE 100 MG PO TABS: 50.0000 mg | ORAL_TABLET | Freq: Every day | ORAL | 5 refills | Status: DC | PRN

## 2018-11-24 NOTE — Patient Instructions (Addendum)
.   Start 1/2 tablet sertraline every morning for 1 week, then increase to 1 tablet daily for 7 days, then 1 1/2 tablet for 7 days, then increase to 2 tablets daily   You can discontinue buspirone when the sertraline starts helping,usually 2-3 weeks  . You can install the GoodRx app on your smart phone to find the lowest prices for generic medications.

## 2018-11-24 NOTE — Progress Notes (Signed)
Patient: Dennis Berger Male    DOB: Feb 03, 1976   43 y.o.   MRN: [id number] Visit Date: 11/24/2018  Today's Provider: Mila Merry, MD   Chief Complaint  Patient presents with  . Diabetes  . Hypertension   Subjective:     HPI  Diabetes Mellitus Type II, Follow-up:   Lab Results  Component Value Date   HGBA1C 7.8 (H) 09/30/2018    Last seen for diabetes 7 weeks ago (seen by Osvaldo Angst, PA-C).  Management since then includes none. Patient was advised to follow up to discuss treatment options. He reports good compliance with treatment. States he has been reducing portion sizes and avoiding sweets and starchy foods, but is not exercising.  Current symptoms include none and have been stable. Home blood sugar records: blood sugars are not being checked  Episodes of hypoglycemia? no   Most Recent Eye Exam: 3 months ago Weight trend: stable Prior visit with dietician: No Current exercise: none Current diet habits: lmiting meating consumption. eating mostly vegetables. .    Pertinent Labs:    Component Value Date/Time   CHOL 194 09/30/2018 1033   TRIG 174 (H) 09/30/2018 1033   HDL 38 (L) 09/30/2018 1033   LDLCALC 121 (H) 09/30/2018 1033   CREATININE 0.77 09/30/2018 1033    Wt Readings from Last 3 Encounters:  11/24/18 (!) 396 lb (179.6 kg)  09/30/18 (!) 394 lb (178.7 kg)  08/20/18 (!) 391 lb (177.4 kg)    ------------------------------------------------------------------------  Hypertension, follow-up:  BP Readings from Last 3 Encounters:  11/24/18 (!) 170/106  09/30/18 (!) 152/100  08/20/18 (!) 150/100    He was last seen for hypertension 7 weeks ago.  BP at that visit was 152/100. Management since that visit includes increasing Amlodipine to 10mg  daily. He reports poor compliance with treatment. He is having side effects. He states the Amlodipine made him feel dizzy and nauseous, so he stopped taking due to feeling like it caused sexual side  effects. Marland Kitchen He is not exercising. He is not adherent to low salt diet.   Outside blood pressures are not being checked. He is experiencing none.  Patient denies chest pain, chest pressure/discomfort, claudication, dyspnea, exertional chest pressure/discomfort, fatigue, irregular heart beat, lower extremity edema, near-syncope, orthopnea, palpitations, paroxysmal nocturnal dyspnea, syncope and tachypnea.   Cardiovascular risk factors include diabetes mellitus, hypertension and male gender.  Use of agents associated with hypertension: none.     Weight trend: stable Wt Readings from Last 3 Encounters:  11/24/18 (!) 396 lb (179.6 kg)  09/30/18 (!) 394 lb (178.7 kg)  08/20/18 (!) 391 lb (177.4 kg)    Current diet: vegetarian  ------------------------------------------------------------------------    Follow up for Depression  The patient was last seen for this 7 weeks ago. Changes made at last visit include starting Buspar.  He reports fair compliance with treatment. He feels that condition is Improved. He is having side effects (sexual side effects and nausea). He decreased  Buspar to 1 tablet daily.    ------------------------------------------------------------------------------------  Allergies  Allergen Reactions  . Motrin [Ibuprofen] Shortness Of Breath  . Nsaids   . Penicillins Hives   Depression screen [facility] 2/9 09/30/2018 08/20/2018  Decreased Interest 0 3  Down, Depressed, Hopeless 0 1  PHQ - 2 Score 0 4  Altered sleeping 2 3  Tired, decreased energy 1 3  Change in appetite 1 2  Feeling bad or failure about yourself  0 1  Trouble concentrating 0  0  Moving slowly or fidgety/restless 0 0  Suicidal thoughts 0 0  PHQ-9 Score 4 13  Difficult doing work/chores Not difficult at all Very difficult     Current Outpatient Medications:  .  omeprazole (PRILOSEC) 20 MG capsule, Take 20 mg by mouth daily., Disp: , Rfl:  .  amLODipine (NORVASC) 10 MG tablet, TAKE 1 TABLET  BY MOUTH ONCE DAILY (Patient not taking: Reported on 11/24/2018), Disp: 90 tablet, Rfl: 0 .  busPIRone (BUSPAR) 15 MG tablet, Take 1 tablet (15 mg total) by mouth 2 (two) times daily. (Patient taking differently, taking one in the morning), Disp: 180 tablet, Rfl: 0  Review of Systems  Constitutional: Negative for appetite change, chills and fever.  Respiratory: Negative for chest tightness, shortness of breath and wheezing.   Cardiovascular: Negative for chest pain and palpitations.  Gastrointestinal: Negative for abdominal pain, nausea and vomiting.    Social History   Tobacco Use  . Smoking status: Former Smoker    Last attempt to quit: 09/17/2005    Years since quitting: 13.1  . Smokeless tobacco: Never Used  . Tobacco comment: Quit 2007  Substance Use Topics  . Alcohol use: Not Currently    Alcohol/week: 0.0 standard drinks      Objective:   BP (!) 170/106 (BP Location: Right Arm, Cuff Size: Large)   Pulse 76   Temp 98.7 F (37.1 C) (Oral)   Resp 16   Ht 6\' 3"  (1.905 m)   Wt (!) 396 lb (179.6 kg)   BMI 49.50 kg/m  Vitals:   11/24/18 0832 11/24/18 0837  BP: (!) 172/110 (!) 170/106  Pulse: 76   Resp: 16   Temp: 98.7 F (37.1 C)   TempSrc: Oral   Weight: (!) 396 lb (179.6 kg)   Height: 6\' 3"  (1.905 m)      Physical Exam  General Appearance:    Alert, cooperative, no distress, morbidly obese  Eyes:    PERRL, conjunctiva/corneas clear, EOM's intact       Lungs:     Clear to auscultation bilaterally, respirations unlabored  Heart:    Regular rate and rhythm  Neurologic:   Awake, alert, oriented x 3. No apparent focal neurological           defect.           Assessment & Plan    1. Anxiety Has cut down on buspirone due to side effects. Will try low dose of sertraline with titration - sertraline (ZOLOFT) 50 MG tablet; Take 1 tablet (50 mg total) by mouth daily. Titrate as directed up to 2 tablet daily  Dispense: 60 tablet; Refill: 1  2. Morbid obesity  (HCC)   3. Hypertension, unspecified type Likely exacerbated by anxiety. Off of amlodipine due to suspected adverse reaction by patient. Will likely need to try another agent, but see how much BP improves when anxiety is under control.   4. Erectile dysfunction, unspecified erectile dysfunction type try- sildenafil (VIAGRA) 100 MG tablet; Take 0.5-1 tablets (50-100 mg total) by mouth daily as needed for erectile dysfunction.  Dispense: 8 tablet; Refill: 5  Future Appointments  Date Time Provider Department Center  12/23/2018  2:40 PM , Demetrios Isaacs, MD [facility] None       Mila Merry, MD  [facility]

## 2018-12-23 ENCOUNTER — Ambulatory Visit: Payer: Self-pay | Admitting: Family Medicine

## 2019-02-09 ENCOUNTER — Other Ambulatory Visit: Payer: Self-pay | Admitting: Physician Assistant

## 2019-02-09 DIAGNOSIS — I10 Essential (primary) hypertension: Secondary | ICD-10-CM

## 2019-02-11 NOTE — Telephone Encounter (Signed)
Please review

## 2019-02-20 ENCOUNTER — Other Ambulatory Visit: Payer: Self-pay | Admitting: Family Medicine

## 2019-02-20 DIAGNOSIS — F419 Anxiety disorder, unspecified: Secondary | ICD-10-CM

## 2019-02-20 NOTE — Telephone Encounter (Signed)
Need to know if patient is taking one or two sertraline tablet s a day. If taking two then we can change to 100mg  tablets.

## 2019-02-23 NOTE — Telephone Encounter (Signed)
Pt states he takes one tablet a day for now.  He has an appointment with you on 03/03/2019.   Thanks,   -Mickel Baas

## 2019-03-03 ENCOUNTER — Other Ambulatory Visit: Payer: Self-pay

## 2019-03-03 ENCOUNTER — Ambulatory Visit (INDEPENDENT_AMBULATORY_CARE_PROVIDER_SITE_OTHER): Payer: Self-pay | Admitting: Family Medicine

## 2019-03-03 ENCOUNTER — Encounter: Payer: Self-pay | Admitting: Family Medicine

## 2019-03-03 VITALS — BP 162/100 | HR 64 | Temp 98.1°F | Resp 18 | Wt 399.0 lb

## 2019-03-03 DIAGNOSIS — F419 Anxiety disorder, unspecified: Secondary | ICD-10-CM

## 2019-03-03 DIAGNOSIS — I10 Essential (primary) hypertension: Secondary | ICD-10-CM

## 2019-03-03 DIAGNOSIS — E119 Type 2 diabetes mellitus without complications: Secondary | ICD-10-CM

## 2019-03-03 LAB — POCT GLYCOSYLATED HEMOGLOBIN (HGB A1C): Hemoglobin A1C: 6.8 % — AB (ref 4.0–5.6)

## 2019-03-03 MED ORDER — LISINOPRIL 10 MG PO TABS: 10.0000 mg | ORAL_TABLET | Freq: Every day | ORAL | 1 refills | Status: DC

## 2019-03-03 MED ORDER — SERTRALINE HCL 50 MG PO TABS: 100.0000 mg | ORAL_TABLET | Freq: Every day | ORAL | 0 refills | Status: DC

## 2019-03-03 NOTE — Progress Notes (Signed)
Patient: Dennis Berger Male    DOB: 05-22-1976   43 y.o.   MRN: [id number] Visit Date: 03/03/2019  Today's Provider: Lelon Huh, MD   Chief Complaint  Patient presents with  . Anxiety  . Hypertension   Subjective:     HPI  Hypertension, follow-up:  BP Readings from Last 3 Encounters:  03/03/19 (!) 162/100  11/24/18 (!) 170/106  09/30/18 (!) 152/100    He was last seen for hypertension 3 months ago.  BP at that visit was 170/106. Management since that visit includes no changes. He reports poor compliance with treatment (Patient is not taking any medications for blood pressure) He states he stopped amlodipine due to ED and making his legs swell, both of which have improved since stopping medication.  He is not having side effects.  He is not exercising. He is adherent to low salt diet.   Outside blood pressures are not being checked. He is experiencing none.  Patient denies chest pain, chest pressure/discomfort, claudication, dyspnea, exertional chest pressure/discomfort, fatigue, irregular heart beat, lower extremity edema, near-syncope, orthopnea, palpitations, paroxysmal nocturnal dyspnea, syncope and tachypnea.   Cardiovascular risk factors include hypertension and male gender.  Use of agents associated with hypertension: none.     Weight trend: fluctuating a bit Wt Readings from Last 3 Encounters:  03/03/19 (!) 399 lb (181 kg)  11/24/18 (!) 396 lb (179.6 kg)  09/30/18 (!) 394 lb (178.7 kg)    Current diet: well balanced  ------------------------------------------------------------------------  Follow up for Anxiety:  The patient was last seen for this 3 months ago. Changes made at last visit include starting low dose if Sertraline with titration.  He reports good compliance with treatment. He feels that condition is Improved. He states he felt considerably more relaxed and didn't worry excessively the first few weeks he started medication, but has  gradually lost effectiveness since then.  He is not having side effects.   ------------------------------------------------------------------------------------ Follow up diabetes   Last a1c in January was 7.8. has since cut back on sweets and starchy foods, but does consume generous proportions of orange juice every day.    No follow-ups on file. up diab Allergies  Allergen Reactions  . Motrin [Ibuprofen] Shortness Of Breath  . Nsaids   . Penicillins Hives     Current Outpatient Medications:  .  omeprazole (PRILOSEC) 20 MG capsule, Take 20 mg by mouth daily., Disp: , Rfl:  .  sertraline (ZOLOFT) 50 MG tablet, Take 1 tablet (50 mg total) by mouth daily., Disp: 90 tablet, Rfl: 1  .  sildenafil (VIAGRA) 100 MG tablet, Take 0.5-1 tablets (50-100 mg total) by mouth daily as needed for erectile dysfunction. (Patient not taking: Reported on 03/03/2019), Disp: 8 tablet, Rfl: 5  Review of Systems  Constitutional: Negative for appetite change, chills and fever.  Respiratory: Negative for chest tightness, shortness of breath and wheezing.   Cardiovascular: Negative for chest pain and palpitations.  Gastrointestinal: Negative for abdominal pain, nausea and vomiting.  Neurological: Positive for headaches.    Social History   Tobacco Use  . Smoking status: Former Smoker    Quit date: 09/17/2005    Years since quitting: 13.4  . Smokeless tobacco: Never Used  . Tobacco comment: Quit 2007  Substance Use Topics  . Alcohol use: Not Currently    Alcohol/week: 0.0 standard drinks      Objective:   BP (!) 162/100 (BP Location: Right Arm, Cuff Size: Large)  Pulse 64   Temp 98.1 F (36.7 C) (Oral)   Resp 18   Wt (!) 399 lb (181 kg)   SpO2 96% Comment: room air  BMI 49.87 kg/m  Vitals:   03/03/19 1136 03/03/19 1139  BP: (!) 162/100 (!) 162/100  Pulse: 64   Resp: 18   Temp: 98.1 F (36.7 C)   TempSrc: Oral   SpO2: 96%   Weight: (!) 399 lb (181 kg)      Physical Exam    General Appearance:    Alert, cooperative, no distress  Eyes:    PERRL, conjunctiva/corneas clear, EOM's intact       Lungs:     Clear to auscultation bilaterally, respirations unlabored  Heart:    Regular rate and rhythm  Neurologic:   Awake, alert, oriented x 3. No apparent focal neurological           defect.       Results for orders placed or performed in visit on 03/03/19  POCT glycosylated hemoglobin (Hb A1C)  Result Value Ref Range   Hemoglobin A1C 6.8 (A) 4.0 - 5.6 %       Assessment & Plan    1. Essential hypertension Did not tolerate amlodipine. Start lisinopril 10mg  daily and Counseled regarding prudent diet and regular exercise.    2. Controlled type 2 diabetes mellitus without complication, without long-term current use of insulin (HCC) Improved with diet.  - POCT glycosylated hemoglobin (Hb A1C)  3. Anxiety Initially responded lo 50mg  sertraline, will double dose to- sertraline (ZOLOFT) 50 MG tablet; Take 2 tablets (100 mg total) by mouth daily.  He has enough left from his recent refill to get by for 5-6 weeks. Will reassess at that time  4. Morbid obesity Counseled regarding prudent diet and regular exercise.   The entirety of the information documented in the History of Present Illness, Review of Systems and Physical Exam were personally obtained by me. Portions of this information were initially documented by Awilda Billoshena Chambers, CMA and reviewed by me for thoroughness and accuracy.      Mila Merryonald Verle Wheeling, MD  [facility]

## 2019-03-03 NOTE — Patient Instructions (Signed)
.   Please review the attached list of medications and notify my office if there are any errors.   . Please bring all of your medications to every appointment so we can make sure that our medication list is the same as yours.   

## 2019-04-02 ENCOUNTER — Other Ambulatory Visit: Payer: Self-pay | Admitting: Family Medicine

## 2019-04-02 DIAGNOSIS — I10 Essential (primary) hypertension: Secondary | ICD-10-CM

## 2019-04-07 ENCOUNTER — Ambulatory Visit: Payer: Self-pay | Admitting: Family Medicine

## 2019-04-24 ENCOUNTER — Other Ambulatory Visit: Payer: Self-pay

## 2019-04-24 ENCOUNTER — Encounter: Payer: Self-pay | Admitting: Family Medicine

## 2019-04-24 ENCOUNTER — Ambulatory Visit (INDEPENDENT_AMBULATORY_CARE_PROVIDER_SITE_OTHER): Payer: Self-pay | Admitting: Family Medicine

## 2019-04-24 VITALS — BP 153/77 | HR 67 | Temp 97.8°F | Resp 16 | Wt 396.8 lb

## 2019-04-24 DIAGNOSIS — I10 Essential (primary) hypertension: Secondary | ICD-10-CM

## 2019-04-24 DIAGNOSIS — F419 Anxiety disorder, unspecified: Secondary | ICD-10-CM

## 2019-04-24 MED ORDER — SERTRALINE HCL 100 MG PO TABS: 100.0000 mg | ORAL_TABLET | Freq: Every day | ORAL | 2 refills | Status: DC

## 2019-04-24 NOTE — Patient Instructions (Signed)
.   Please review the attached list of medications and notify my office if there are any errors.   . Please bring all of your medications to every appointment so we can make sure that our medication list is the same as yours.   . We will have flu vaccines available after Labor Day. Please go to your pharmacy or call the office in early September to schedule you flu shot.   

## 2019-04-24 NOTE — Progress Notes (Signed)
Patient: Dennis Berger Male    DOB: 1975/11/06   43 y.o.   MRN: [id number] Visit Date: 04/24/2019  Today's Provider: Lelon Huh, MD   Chief Complaint  Patient presents with  . Hypertension  . Anxiety   Subjective:     HPI  Hypertension, follow-up:  BP Readings from Last 3 Encounters:  04/24/19 (!) 153/77  03/03/19 (!) 162/100  11/24/18 (!) 170/106    He was last seen for hypertension 2 months ago.  BP at that visit was 162/100. Management changes since that visit include starting patient on Lisinopril 10mg . He reports excellent compliance with treatment. He is not having side effects.  He is exercising. He is adherent to low salt diet.   Outside blood pressures are not being checked. He is experiencing none.  Patient denies chest pain, chest pressure/discomfort, claudication, dyspnea, exertional chest pressure/discomfort, fatigue, irregular heart beat, lower extremity edema, near-syncope, orthopnea, palpitations, paroxysmal nocturnal dyspnea, syncope and tachypnea.   Cardiovascular risk factors include diabetes mellitus, hypertension, male gender and obesity (BMI >= 30 kg/m2).  Use of agents associated with hypertension: none.     Weight trend: decreasing steadily Wt Readings from Last 3 Encounters:  04/24/19 (!) 396 lb 12.8 oz (180 kg)  03/03/19 (!) 399 lb (181 kg)  11/24/18 (!) 396 lb (179.6 kg)    Current diet: well balanced  ------------------------------------------------------------------------  Follow up for Anxiety  The patient was last seen for this 2 months ago. Changes made at last visit include increased Sertraline to 100mg .  He reports excellent compliance with treatment. He feels that condition is Improved. He is not having side effects.   ------------------------------------------------------------------------------------  Allergies  Allergen Reactions  . Motrin [Ibuprofen] Shortness Of Breath  . Amlodipine     Swelling, ED  .  Nsaids   . Penicillins Hives     Current Outpatient Medications:  .  lisinopril (ZESTRIL) 10 MG tablet, TAKE 1 TABLET BY MOUTH ONCE DAILY, Disp: 30 tablet, Rfl: 2 .  omeprazole (PRILOSEC) 20 MG capsule, Take 20 mg by mouth daily., Disp: , Rfl:  .  sertraline (ZOLOFT) 50 MG tablet, Take 2 tablets (100 mg total) by mouth daily., Disp: 3 tablet, Rfl: 0 .  sildenafil (VIAGRA) 100 MG tablet, Take 0.5-1 tablets (50-100 mg total) by mouth daily as needed for erectile dysfunction. (Patient not taking: Reported on 03/03/2019), Disp: 8 tablet, Rfl: 5  Review of Systems  Constitutional: Negative for appetite change, chills and fever.  Respiratory: Negative for chest tightness, shortness of breath and wheezing.   Cardiovascular: Negative for chest pain and palpitations.  Gastrointestinal: Negative for abdominal pain, nausea and vomiting.    Social History   Tobacco Use  . Smoking status: Former Smoker    Quit date: 09/17/2005    Years since quitting: 13.6  . Smokeless tobacco: Never Used  . Tobacco comment: Quit 2007  Substance Use Topics  . Alcohol use: Not Currently    Alcohol/week: 0.0 standard drinks      Objective:   BP (!) 153/77   Pulse 67   Temp 97.8 F (36.6 C) (Oral)   Resp 16   Wt (!) 396 lb 12.8 oz (180 kg)   BMI 49.60 kg/m  Vitals:   04/24/19 1043  BP: (!) 153/77  Pulse: 67  Resp: 16  Temp: 97.8 F (36.6 C)  TempSrc: Oral  Weight: (!) 396 lb 12.8 oz (180 kg)     Physical Exam  General Appearance:  Alert, cooperative, no distress, obese  Eyes:    PERRL, conjunctiva/corneas clear, EOM's intact       Lungs:     Clear to auscultation bilaterally, respirations unlabored  Heart:    Normal heart rate. Normal rhythm. No murmurs, rubs, or gallops.   Neurologic:   Awake, alert, oriented x 3. No apparent focal neurological           defect.           Assessment & Plan    1. Anxiety Much better and tolerating doubling sertraline to 2 x 50mg  a day. Will refill  100mg  tablets.  - Renal function panel - sertraline (ZOLOFT) 100 MG tablet; Take 1 tablet (100 mg total) by mouth daily.  Dispense: 90 tablet; Refill: 2  2. Essential hypertension Improved on lisinopril.  - Renal function panel Increase lisinopril or add hctz after reviewing lab results.   The entirety of the information documented in the History of Present Illness, Review of Systems and Physical Exam were personally obtained by me. Portions of this information were initially documented by Fonda KinderKathleen J Wolford, CMA and reviewed by me for thoroughness and accuracy.      Mila Merryonald Eliaz Fout, MD  [facility]

## 2019-05-09 ENCOUNTER — Telehealth: Payer: Self-pay | Admitting: Family Medicine

## 2019-05-09 DIAGNOSIS — F419 Anxiety disorder, unspecified: Secondary | ICD-10-CM

## 2019-05-09 NOTE — Telephone Encounter (Signed)
Labs were ordered on 04-24-2019 to make sure kidney functions and electrolytes are stable since starting lisinopril, but there are still results. Please check with patient to make sure he gets labs drawn before next prescription refill. He does not have to be fasting.

## 2019-05-11 NOTE — Telephone Encounter (Signed)
LMTCB 05/11/2019  Thanks,   -Laura  

## 2019-05-14 NOTE — Telephone Encounter (Signed)
Pt advised.   He states he does not have the money right now to get the labs drawn.    Thanks,   -Mickel Baas

## 2020-02-24 ENCOUNTER — Other Ambulatory Visit: Payer: Self-pay | Admitting: Family Medicine

## 2020-02-24 DIAGNOSIS — F419 Anxiety disorder, unspecified: Secondary | ICD-10-CM

## 2020-02-24 MED ORDER — SERTRALINE HCL 100 MG PO TABS: 100.0000 mg | ORAL_TABLET | Freq: Every day | ORAL | 0 refills | Status: DC

## 2020-02-24 NOTE — Addendum Note (Signed)
Addended by: Ledon Snare A on: 02/24/2020 08:46 AM   Modules accepted: Orders

## 2020-02-24 NOTE — Telephone Encounter (Signed)
Medication Refill - Medication:  sertraline (ZOLOFT) 100 MG tablet  Has the patient contacted their pharmacy?  Yes advised to call office.  Preferred Pharmacy (with phone number or street name):  [facility] - [place], [place] - [address] Phone:  [phone]  Fax:  [phone]     Agent: Please be advised that RX refills may take up to 3 business days. We ask that you follow-up with your pharmacy.

## 2020-02-24 NOTE — Telephone Encounter (Signed)
Pt. Has appointment for 03/01/20.

## 2020-02-29 NOTE — Progress Notes (Deleted)
     Established patient visit   Patient: Dennis Berger   DOB: 02/24/1976   44 y.o. Male  MRN: [id number] Visit Date: 03/01/2020  Today's healthcare provider: Mila Merry, MD   No chief complaint on file.  Subjective    HPI Hypertension, follow-up  BP Readings from Last 3 Encounters:  04/24/19 (!) 153/77  03/03/19 (!) 162/100  11/24/18 (!) 170/106   Wt Readings from Last 3 Encounters:  04/24/19 (!) 396 lb 12.8 oz (180 kg)  03/03/19 (!) 399 lb (181 kg)  11/24/18 (!) 396 lb (179.6 kg)     He was last seen for hypertension 10 months ago.  BP at that visit was 153/77. Management since that visit includes labs ordered. Patient did not get labs done due to no insurance coverage.  He reports fair compliance with treatment. He {is/is not:9024} having side effects. {document side effects if present:1} He is following a {diet:21022986} diet. He {is/is not:9024} exercising. He {does/does not:200015} smoke.  Use of agents associated with hypertension: {bp agents assoc with hypertension:511::"none"}.   Outside blood pressures are {***enter patient reported home BP readings, or 'not being checked':1}. Symptoms: {Yes/No:20286} chest pain {Yes/No:20286} chest pressure  {Yes/No:20286} palpitations {Yes/No:20286} syncope  {Yes/No:20286} dyspnea {Yes/No:20286} orthopnea  {Yes/No:20286} paroxysmal nocturnal dyspnea {Yes/No:20286} lower extremity edema   Pertinent labs: Lab Results  Component Value Date   CHOL 194 09/30/2018   HDL 38 (L) 09/30/2018   LDLCALC 121 (H) 09/30/2018   TRIG 174 (H) 09/30/2018   CHOLHDL 5.1 (H) 09/30/2018   Lab Results  Component Value Date   NA 141 09/30/2018   K 4.5 09/30/2018   CREATININE 0.77 09/30/2018   GFRNONAA 112 09/30/2018   GFRAA 129 09/30/2018   GLUCOSE 165 (H) 09/30/2018     The 10-year ASCVD risk score Denman George DC Jr., et al., 2013) is: 7.4%    --------------------------------------------------------------------------------------------------- Follow up for Anxiety:  The patient was last seen for this 10 months ago. Changes made at last visit include no change.  He reports {excellent/good/fair/poor:19665} compliance with treatment. He feels that condition is {improved/worse/unchanged:3041574}. He {is/is not:21021397} having side effects. ***  -----------------------------------------------------------------------------------------    {Show patient history (optional):23778::" "}   Medications: Outpatient Medications Prior to Visit  Medication Sig  . lisinopril (ZESTRIL) 10 MG tablet TAKE 1 TABLET BY MOUTH ONCE DAILY  . omeprazole (PRILOSEC) 20 MG capsule Take 20 mg by mouth daily.  . sertraline (ZOLOFT) 100 MG tablet Take 1 tablet (100 mg total) by mouth daily.  . sildenafil (VIAGRA) 100 MG tablet Take 0.5-1 tablets (50-100 mg total) by mouth daily as needed for erectile dysfunction. (Patient not taking: Reported on 03/03/2019)   No facility-administered medications prior to visit.    Review of Systems  Constitutional: Negative.   Respiratory: Negative.   Cardiovascular: Negative.   Musculoskeletal: Negative.   Psychiatric/Behavioral: The patient is nervous/anxious.     {Heme  Chem  Endocrine  Serology  Results Review (optional):23779::" "}  Objective    There were no vitals taken for this visit. {Show previous vital signs (optional):23777::" "}  Physical Exam  ***  No results found for any visits on 03/01/20.  Assessment & Plan     ***  No follow-ups on file.      {provider attestation***:1}   Mila Merry, MD  [facility] [phone] (phone) [phone] (fax)  [facility]

## 2020-03-01 ENCOUNTER — Ambulatory Visit: Payer: Self-pay | Admitting: Family Medicine

## 2020-03-04 NOTE — Progress Notes (Signed)
Established patient visit   Patient: Dennis Berger   DOB: 09-Jul-1976   44 y.o. Male  MRN: [id number] Visit Date: 03/07/2020  Today's healthcare provider: Mila Merry, MD   Chief Complaint  Patient presents with  . Anxiety  . Hypertension  . Diabetes   Subjective    HPI Anxiety, Follow-up  He was last seen for anxiety 10 months ago. Changes made at last visit include none; continue Sertraline 100mg  daily.   He reports good compliance with treatment.  He reports good tolerance of treatment. He increased Zoloft to 2 tablets daily about 1 week ago due to increased stress. He states his wife just had a baby 7 days ago. He has 6 other children at home. Patient states his anxiety has improved since increasing Zoloft. He is not having side effects.   He feels his anxiety is moderate and Worse since last visit.  Symptoms: No chest pain No difficulty concentrating  No dizziness No fatigue  No feelings of losing control No insomnia  Yes irritable No palpitations  No panic attacks No racing thoughts  No shortness of breath No sweating  No tremors/shakes    GAD-7 Results GAD-7 Generalized Anxiety Disorder Screening Tool 03/07/2020  1. Feeling Nervous, Anxious, or on Edge 0  2. Not Being Able to Stop or Control Worrying 0  3. Worrying Too Much About Different Things 0  4. Trouble Relaxing 0  5. Being So Restless it's Hard To Sit Still 0  6. Becoming Easily Annoyed or Irritable 0  7. Feeling Afraid As If Something Awful Might Happen 0  Total GAD-7 Score 0  Difficulty At Work, Home, or Getting  Along With Others? Not difficult at all    PHQ-9 Scores PHQ9 SCORE ONLY 03/07/2020 09/30/2018 08/20/2018  PHQ-9 Total Score 0 4 13    ---------------------------------------------------------------------------------------------------  Hypertension, follow-up  BP Readings from Last 3 Encounters:  03/07/20 (!) 162/91  04/24/19 (!) 153/77  03/03/19 (!) 162/100   Wt Readings from  Last 3 Encounters:  03/07/20 (!) 390 lb (176.9 kg)  04/24/19 (!) 396 lb 12.8 oz (180 kg)  03/03/19 (!) 399 lb (181 kg)     He was last seen for hypertension 10 months ago.  BP at that visit was 153/77. Management since that visit includes ordering labs; patient did not complete blood work.  He reports poor compliance with treatment. He is having side effects. (Leg swelling) He is following a Regular diet. He is exercising. He does not smoke.  Use of agents associated with hypertension: NSAIDS.   Outside blood pressures are systolic 130's. Patient unsure of Diastolic reading. Symptoms: No chest pain No chest pressure  No palpitations No syncope  No dyspnea No orthopnea  No paroxysmal nocturnal dyspnea No lower extremity edema   Pertinent labs: Lab Results  Component Value Date   CHOL 194 09/30/2018   HDL 38 (L) 09/30/2018   LDLCALC 121 (H) 09/30/2018   TRIG 174 (H) 09/30/2018   CHOLHDL 5.1 (H) 09/30/2018   Lab Results  Component Value Date   NA 141 09/30/2018   K 4.5 09/30/2018   CREATININE 0.77 09/30/2018   GFRNONAA 112 09/30/2018   GFRAA 129 09/30/2018   GLUCOSE 165 (H) 09/30/2018     The 10-year ASCVD risk score 10/02/2018 DC Jr., et al., 2013) is: 8.2%   ---------------------------------------------------------------------------------------------------  Diabetes Mellitus Type II, Follow-up  Lab Results  Component Value Date   HGBA1C 7.3 (A) 03/07/2020   HGBA1C 6.8 (  A) 03/03/2019   HGBA1C 7.8 (H) 09/30/2018   Wt Readings from Last 3 Encounters:  03/07/20 (!) 390 lb (176.9 kg)  04/24/19 (!) 396 lb 12.8 oz (180 kg)  03/03/19 (!) 399 lb (181 kg)   Last seen for diabetes 1 year ago.This was a new diagnosis for him and he was advised to follow up in the office to discuss treatment..  Management since then includes none.  He reports good compliance with treatment. He is not having side effects.  Symptoms: No fatigue No foot ulcerations  No appetite changes No  nausea  No paresthesia of the feet  No polydipsia  No polyuria No visual disturbances   No vomiting     Home blood sugar records: fasting range: 120-140  Episodes of hypoglycemia? No    Current insulin regiment: none Most Recent Eye Exam: <1 year ago Current exercise: walking and weightlifting Current diet habits: well balanced  Pertinent Labs: Lab Results  Component Value Date   CHOL 194 09/30/2018   HDL 38 (L) 09/30/2018   LDLCALC 121 (H) 09/30/2018   TRIG 174 (H) 09/30/2018   CHOLHDL 5.1 (H) 09/30/2018   Lab Results  Component Value Date   NA 141 09/30/2018   K 4.5 09/30/2018   CREATININE 0.77 09/30/2018   GFRNONAA 112 09/30/2018   GFRAA 129 09/30/2018   GLUCOSE 165 (H) 09/30/2018     ---------------------------------------------------------------------------------------------------   Medications: Outpatient Medications Prior to Visit  Medication Sig  . lisinopril (ZESTRIL) 10 MG tablet TAKE 1 TABLET BY MOUTH ONCE DAILY  . omeprazole (PRILOSEC) 20 MG capsule Take 20 mg by mouth daily.  . sertraline (ZOLOFT) 100 MG tablet Take 1 tablet (100 mg total) by mouth daily.  . sildenafil (VIAGRA) 100 MG tablet Take 0.5-1 tablets (50-100 mg total) by mouth daily as needed for erectile dysfunction. (Patient not taking: Reported on 03/03/2019)   No facility-administered medications prior to visit.    Review of Systems  Constitutional: Negative for appetite change, chills and fever.  Respiratory: Negative for chest tightness, shortness of breath and wheezing.   Cardiovascular: Negative for chest pain and palpitations.  Gastrointestinal: Negative for abdominal pain, nausea and vomiting.  Psychiatric/Behavioral: Positive for agitation. The patient is nervous/anxious.      Objective    BP (!) 162/91 (BP Location: Right Arm, Cuff Size: Large)   Pulse 77   Temp 97.7 F (36.5 C) (Temporal)   Resp 20   Wt (!) 390 lb (176.9 kg)   BMI 48.75 kg/m   Physical Exam  General  appearance: Severely obese male, cooperative and in no acute distress Head: Normocephalic, without obvious abnormality, atraumatic Respiratory: Respirations even and unlabored, normal respiratory rate Extremities: All extremities are intact.  Skin: Skin color, texture, turgor normal. No rashes seen  Psych: Appropriate mood and affect. Neurologic: Mental status: Alert, oriented to person, place, and time, thought content appropriate.   Results for orders placed or performed in visit on 03/07/20  POCT HgB A1C  Result Value Ref Range   Hemoglobin A1C 7.3 (A) 4.0 - 5.6 %   Est. average glucose Bld gHb Est-mCnc 163     Assessment & Plan     1. Anxiety Has recently increased to 2 100mg  tablet daily which he feels is working much better. Will continue at that dose, but may wean back down to 100 if stressful environments improve.  - sertraline (ZOLOFT) 100 MG tablet; Take 1-2 tablets (100-200 mg total) by mouth daily.  Dispense: 60 tablet;  Refill: 3  2. Type 2 diabetes mellitus with hyperglycemia, without long-term current use of insulin (HCC) Given 2 samples- Semaglutide,0.25 or 0.5MG /DOS, (OZEMPIC, 0.25 OR 0.5 MG/DOSE,) 2 MG/1.5ML SOPN; Inject 0.1875 mLs (0.25 mg total) into the skin once a week. For 4 weeks, then inject 0.5 mg into the skin once a week and continue at this dose.  Dispense: 2 pen For trial purposes. Follow up in 2 months.   3. Morbid obesity (HCC) Expect improvement with initiation of semaglutide.   4. Hypertension.   He stopped lisinopril due to side effects and doesn't really want to take medications. Counseled that I would expect significant BP improvement if he loses weight. Will initiate semaglutide as above and recheck in 8 weeks.   No follow-ups on file.      The entirety of the information documented in the History of Present Illness, Review of Systems and Physical Exam were personally obtained by me. Portions of this information were initially documented by the  CMA and reviewed by me for thoroughness and accuracy.      Mila Merry, MD  [facility] [phone] (phone) [phone] (fax)  [facility]

## 2020-03-07 ENCOUNTER — Other Ambulatory Visit: Payer: Self-pay

## 2020-03-07 ENCOUNTER — Encounter: Payer: Self-pay | Admitting: Family Medicine

## 2020-03-07 ENCOUNTER — Ambulatory Visit (INDEPENDENT_AMBULATORY_CARE_PROVIDER_SITE_OTHER): Payer: Self-pay | Admitting: Family Medicine

## 2020-03-07 VITALS — BP 162/91 | HR 77 | Temp 97.7°F | Resp 20 | Wt 390.0 lb

## 2020-03-07 DIAGNOSIS — I10 Essential (primary) hypertension: Secondary | ICD-10-CM

## 2020-03-07 DIAGNOSIS — F419 Anxiety disorder, unspecified: Secondary | ICD-10-CM

## 2020-03-07 DIAGNOSIS — E1165 Type 2 diabetes mellitus with hyperglycemia: Secondary | ICD-10-CM

## 2020-03-07 LAB — POCT GLYCOSYLATED HEMOGLOBIN (HGB A1C)
Est. average glucose Bld gHb Est-mCnc: 163
Hemoglobin A1C: 7.3 % — AB (ref 4.0–5.6)

## 2020-03-07 MED ORDER — SERTRALINE HCL 100 MG PO TABS: 100.0000 mg | ORAL_TABLET | Freq: Every day | ORAL | 3 refills | Status: DC

## 2020-03-07 MED ORDER — OZEMPIC (0.25 OR 0.5 MG/DOSE) 2 MG/1.5ML ~~LOC~~ SOPN
0.2500 mg | PEN_INJECTOR | SUBCUTANEOUS | Status: AC
Start: 2020-03-07 — End: ?

## 2020-03-07 NOTE — Patient Instructions (Signed)
.   Start once a week Ozempic injection to help sugar, blood pressure, and lose weight. Please follow directions on medication list

## 2020-05-03 ENCOUNTER — Ambulatory Visit: Payer: Self-pay | Admitting: Family Medicine

## 2020-05-10 ENCOUNTER — Telehealth: Payer: Self-pay | Admitting: Family Medicine

## 2020-05-20 ENCOUNTER — Encounter: Payer: Self-pay | Admitting: Family Medicine

## 2020-05-20 NOTE — Telephone Encounter (Signed)
pa

## 2020-05-24 ENCOUNTER — Telehealth: Payer: Self-pay | Admitting: Family Medicine

## 2020-07-24 NOTE — Telephone Encounter (Signed)
Encounter created in error

## 2020-08-23 ENCOUNTER — Other Ambulatory Visit: Payer: Self-pay | Admitting: Family Medicine

## 2020-08-23 DIAGNOSIS — N529 Male erectile dysfunction, unspecified: Secondary | ICD-10-CM

## 2020-09-19 ENCOUNTER — Other Ambulatory Visit: Payer: Self-pay | Admitting: Family Medicine

## 2020-09-19 DIAGNOSIS — F419 Anxiety disorder, unspecified: Secondary | ICD-10-CM

## 2020-09-19 NOTE — Telephone Encounter (Signed)
Attempted to call patient to schedule appointment- mailbox is full- unable to leave message. Courtesy RF #[phone] with note to call office sent to pharmacy

## 2020-10-27 ENCOUNTER — Other Ambulatory Visit: Payer: Self-pay | Admitting: Family Medicine

## 2020-10-27 DIAGNOSIS — F419 Anxiety disorder, unspecified: Secondary | ICD-10-CM

## 2020-10-27 NOTE — Telephone Encounter (Signed)
Courtesy refill. Called patient to schedule appt. No answer. Unable to leave voicemail due to mailbox full.

## 2020-11-23 ENCOUNTER — Other Ambulatory Visit: Payer: Self-pay | Admitting: Family Medicine

## 2020-11-23 DIAGNOSIS — F419 Anxiety disorder, unspecified: Secondary | ICD-10-CM

## 2020-11-23 NOTE — Telephone Encounter (Signed)
Requested Prescriptions  Pending Prescriptions Disp Refills  . sertraline (ZOLOFT) 100 MG tablet [Pharmacy Med Name: SERTRALINE HCL 100 MG TAB] 60 tablet 0    Sig: TAKE 1 OR 2 TABLETS BY MOUTH ONCE DAILY     Psychiatry:  Antidepressants - SSRI Failed - 11/23/2020  5:56 PM      Failed - Valid encounter within last 6 months    Recent Outpatient Visits          8 months ago Anxiety   [facility] Malva Limes, MD   1 year ago Essential hypertension   [facility] Malva Limes, MD   1 year ago Essential hypertension   [facility] Malva Limes, MD   2 years ago Anxiety   [facility] Malva Limes, MD   2 years ago Anxiety   [facility] Osvaldo Angst [place], [place]             Called Pt and left message with wife to call office.

## 2021-01-04 ENCOUNTER — Other Ambulatory Visit: Payer: Self-pay | Admitting: Family Medicine

## 2021-01-04 DIAGNOSIS — F419 Anxiety disorder, unspecified: Secondary | ICD-10-CM

## 2021-01-04 NOTE — Telephone Encounter (Signed)
   Notes to clinic:  courtesy refill already given  Patient has not callback and schedule appt    Requested Prescriptions  Pending Prescriptions Disp Refills   sertraline (ZOLOFT) 100 MG tablet [Pharmacy Med Name: SERTRALINE HCL 100 MG TAB] 60 tablet 0    Sig: TAKE 1 OR 2 TABLETS BY MOUTH ONCE DAILY      Psychiatry:  Antidepressants - SSRI Failed - 01/04/2021 11:42 AM      Failed - Valid encounter within last 6 months    Recent Outpatient Visits           10 months ago Anxiety   [facility] Malva Limes, MD   1 year ago Essential hypertension   [facility] Malva Limes, MD   1 year ago Essential hypertension   [facility] Malva Limes, MD   2 years ago Anxiety   [facility] Malva Limes, MD   2 years ago Anxiety   [facility] Osvaldo Angst [place], [place]

## 2021-02-20 ENCOUNTER — Other Ambulatory Visit: Payer: Self-pay | Admitting: Family Medicine

## 2021-02-20 DIAGNOSIS — F419 Anxiety disorder, unspecified: Secondary | ICD-10-CM

## 2021-02-20 NOTE — Telephone Encounter (Signed)
Refill request for Sertraline last filled 01/05/21; no valid encounter within last 6 months;no upcoming visits noted; pt notified that visit needed so that courtesy refill can be sent; the pt states he has decided to see another provider and he would not like to have this request sent; he was seen by Dr Mila Merry at [facility]; will route to office for notification of encounter.

## 2023-06-12 ENCOUNTER — Telehealth: Payer: Self-pay | Admitting: Family Medicine

## 2023-06-12 NOTE — Telephone Encounter (Signed)
This patient has not been seen in over 3 years. Needs office visit.

## 2023-06-12 NOTE — Telephone Encounter (Signed)
[facility] is requesting prescription refill sertraline (ZOLOFT) 100 MG tablet Please advise

## 2024-05-07 DIAGNOSIS — H903 Sensorineural hearing loss, bilateral: Secondary | ICD-10-CM | POA: Diagnosis not present
